# Patient Record
Sex: Male | Born: 1935 | Race: White | Hispanic: No | Marital: Married | Smoking: Never smoker
Health system: Southern US, Community
[De-identification: ages and names within clinical notes are randomized; demographics above are authoritative.]

## PROBLEM LIST (undated history)

## (undated) DIAGNOSIS — I10 Essential (primary) hypertension: Secondary | ICD-10-CM

## (undated) DIAGNOSIS — E7800 Pure hypercholesterolemia, unspecified: Secondary | ICD-10-CM

## (undated) DIAGNOSIS — M1990 Unspecified osteoarthritis, unspecified site: Secondary | ICD-10-CM

## (undated) HISTORY — DX: Unspecified osteoarthritis, unspecified site: M19.90

## (undated) HISTORY — PX: KNEE SURGERY: SHX244

## (undated) HISTORY — DX: Pure hypercholesterolemia, unspecified: E78.00

## (undated) HISTORY — DX: Essential (primary) hypertension: I10

---

## 2001-11-06 HISTORY — PX: CORONARY STENT PLACEMENT: SHX1402

## 2009-03-23 ENCOUNTER — Inpatient Hospital Stay (HOSPITAL_COMMUNITY): Admission: RE | Admit: 2009-03-23 | Discharge: 2009-03-26 | Payer: Self-pay | Admitting: Specialist

## 2009-05-21 ENCOUNTER — Ambulatory Visit (HOSPITAL_COMMUNITY): Admission: RE | Admit: 2009-05-21 | Discharge: 2009-05-21 | Payer: Self-pay | Admitting: Orthopedic Surgery

## 2009-11-14 IMAGING — CR DG ORBITS FOR FOREIGN BODY
2 series · 2 of 2 positions shown · non-contrast
Comparison: None

CLINICAL DATA: Pre MRI screening.  History of occasional metal
work.

ORBITS FOR FOREIGN BODY - 2 VIEW

[view not recorded (1 of 2)]
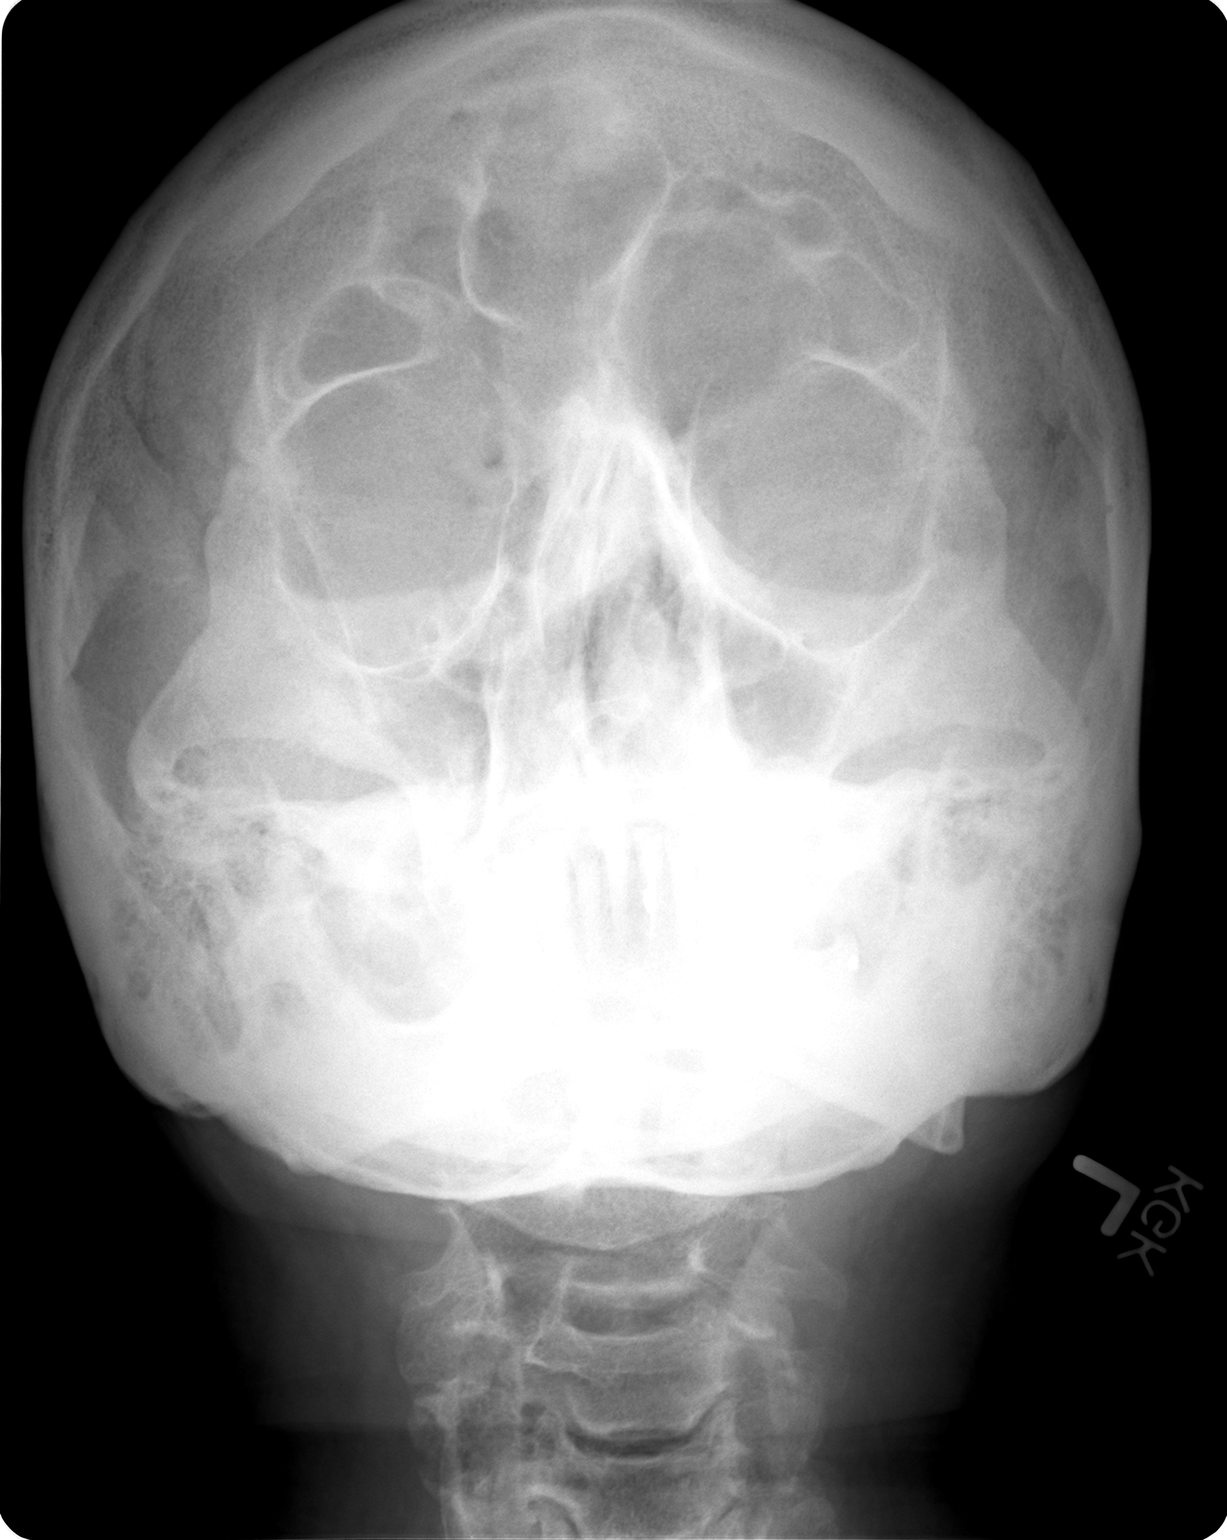

[view not recorded (2 of 2)]
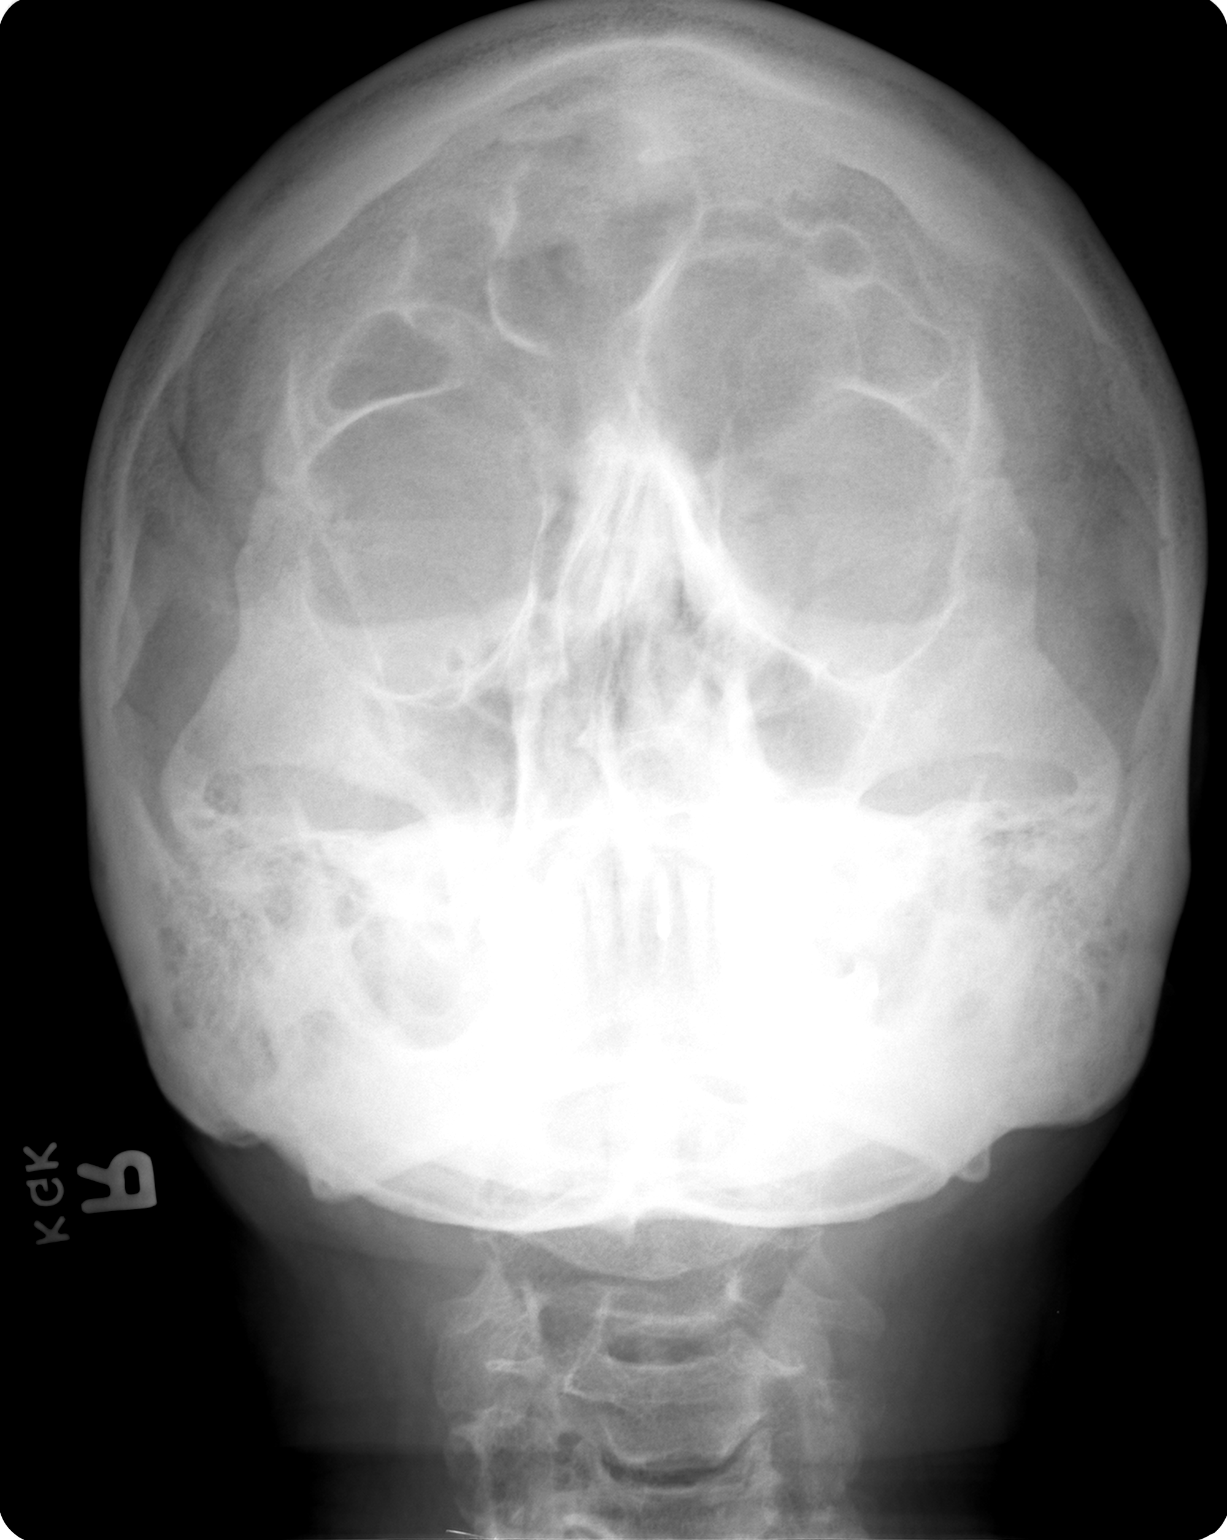

[2 of 2 positions shown; findings below may reference images not displayed]

FINDINGS: No metallic orbital foreign material is appreciated.
IMPRESSION: Negative for metallic orbital foreign body.

## 2011-02-14 LAB — CROSSMATCH

## 2011-02-14 LAB — DIFFERENTIAL
Basophils Absolute: 0 10*3/uL (ref 0.0–0.1)
Basophils Relative: 1 % (ref 0–1)
Eosinophils Absolute: 0.4 10*3/uL (ref 0.0–0.7)
Eosinophils Relative: 9 % — ABNORMAL HIGH (ref 0–5)
Monocytes Absolute: 0.2 10*3/uL (ref 0.1–1.0)
Monocytes Relative: 6 % (ref 3–12)
Neutro Abs: 2.6 10*3/uL (ref 1.7–7.7)

## 2011-02-14 LAB — CBC
HCT: 30.3 % — ABNORMAL LOW (ref 39.0–52.0)
HCT: 33.5 % — ABNORMAL LOW (ref 39.0–52.0)
HCT: 40 % (ref 39.0–52.0)
Hemoglobin: 11.3 g/dL — ABNORMAL LOW (ref 13.0–17.0)
MCHC: 34.2 g/dL (ref 30.0–36.0)
MCV: 97.4 fL (ref 78.0–100.0)
MCV: 98.3 fL (ref 78.0–100.0)
MCV: 98.8 fL (ref 78.0–100.0)
Platelets: 102 10*3/uL — ABNORMAL LOW (ref 150–400)
Platelets: 99 10*3/uL — ABNORMAL LOW (ref 150–400)
Platelets: 146 10*3/uL — ABNORMAL LOW (ref 150–400)
RBC: 3.11 MIL/uL — ABNORMAL LOW (ref 4.22–5.81)
RBC: 3.41 MIL/uL — ABNORMAL LOW (ref 4.22–5.81)
RDW: 12.4 % (ref 11.5–15.5)
WBC: 7.6 10*3/uL (ref 4.0–10.5)
WBC: 8.7 10*3/uL (ref 4.0–10.5)
WBC: 4.2 10*3/uL (ref 4.0–10.5)

## 2011-02-14 LAB — BASIC METABOLIC PANEL
BUN: 18 mg/dL (ref 6–23)
CO2: 27 mEq/L (ref 19–32)
Chloride: 106 mEq/L (ref 96–112)
Chloride: 107 mEq/L (ref 96–112)
Creatinine, Ser: 0.96 mg/dL (ref 0.4–1.5)
GFR calc Af Amer: >60 mL/min (ref >60)
Potassium: 4.1 mEq/L (ref 3.5–5.1)
Sodium: 139 mEq/L (ref 135–145)

## 2011-02-14 LAB — COMPREHENSIVE METABOLIC PANEL
ALT: 21 U/L (ref 0–53)
AST: 30 U/L (ref 0–37)
Albumin: 3.5 g/dL (ref 3.5–5.2)
Alkaline Phosphatase: 66 U/L (ref 39–117)
BUN: 21 mg/dL (ref 6–23)
Chloride: 110 mEq/L (ref 96–112)
Potassium: 3.8 mEq/L (ref 3.5–5.1)
Sodium: 142 mEq/L (ref 135–145)
Total Bilirubin: 0.8 mg/dL (ref 0.3–1.2)

## 2011-02-14 LAB — URINALYSIS, ROUTINE W REFLEX MICROSCOPIC
Bilirubin Urine: NEGATIVE
Glucose, UA: NEGATIVE mg/dL
Ketones, ur: NEGATIVE mg/dL
Specific Gravity, Urine: 1.022 (ref 1.005–1.030)
pH: 5.5 (ref 5.0–8.0)

## 2011-02-14 LAB — URINE MICROSCOPIC-ADD ON

## 2011-02-14 LAB — ABO/RH: ABO/RH(D): A NEG

## 2011-03-21 NOTE — Op Note (Signed)
[name], [name] NO.:  [id number]   MEDICAL RECORD NO.:  [id number]          PATIENT TYPE:  INP   LOCATION:  0011                         FACILITY:  [facility]   PHYSICIAN:  Erasmo Leventhal, M.D.DATE OF BIRTH:  02-12-1936   DATE OF PROCEDURE:  03/23/2009  DATE OF DISCHARGE:                               OPERATIVE REPORT   PREOPERATIVE DIAGNOSIS:  Left knee end-stage osteoarthritis.   POSTOPERATIVE DIAGNOSIS:  Left knee end-stage osteoarthritis.   PROCEDURE:  Left total knee arthroplasty.   SURGEON:  Erasmo Leventhal, M.D.   ASSISTANT:  Oneida Alar, PA-C.   ANESTHESIA:  Spinal with monitored anesthesia care.   ESTIMATED BLOOD LOSS:  Less than 100 mL.   DRAINS:  One Hemovac.   COMPLICATIONS:  None.   TOURNIQUET TIME:  One hour 20 minutes at 300 mmHg.   OPERATIVE DETAILS:  The patient was counseled in the holding area.  The  correct side was identified.  Taken to the operating room where spinal  anesthetic was administered.  IV antibiotics were given.  A Foley  catheter was placed utilizing sterile technique by the OR circulating  nurse.  All extremities well padded and bumped.  The left knee was  examined.  He lacked 5 degrees of full extension.  He could flex to 110  degrees.  Elevated, prepped with DuraPrep and draped in a sterile  fashion.  Exsanguinated with an Esmarch and tourniquet was inflated to  300 mmHg.  A straight midline incision was made through the skin  subcutaneous tissue.  A thickened prepatellar bursa was encountered.  It  was excised.  A medial parapatellar arthrotomy was performed.  A  proximal medial soft tissue release was done due to his varus  malalignment.  The cruciate ligaments were resected.  A starter hole  made in the distal femur.  The canal was irrigated, the effluent was  clear.  Intramedullary rod was gently placed.  I chose a 5 degree valgus  cut with a 10 mm cut off the distal femur.  The distal femur was  found  to be a size #5.  Rotation marks were set and cut to fit a size #5.  Medial and lateral menisci removed under direct visualization.  Geniculate vessels were coagulated.Marland Kitchen  Extramedullary alignment guide was  aligned from the tibia, set for correct rotation and slope.  I took a 2  mm cut off the most deficient side which was the medial side.  This gave  a nice resection of the proximal tibia.  Posteromedial and  posterolateral osteophytes were removed under direct visualization.  With flexion and extension, blocks for a 10 insert were well-balanced.   The tibia was exposed and found be a size #5.  Rotation coverage were  set, reamer and punch was performed.  __________ was performed a femoral  trial was placed.  With a size 5 femur, size 5 tibia and 10 insert were  well-balanced in flexion and extension.  Trials were balanced, gaps were  balanced, EPS were balanced.  The patella was found to be a size 41.  Appropriate amount of bone was resected and locking holes were made for  a size 41 patella.  The patella tracked anatomically.  All trials were  removed.  The knee was irrigated with pulsatile lavage.  Utilizing  modern cement technique, all components were cemented into place.  A  size 5 tibia, a size 5 femur with a 41 patella were cemented and held in  place with a 10 mm trial insert.  After the cement was cured, excess  cement was removed.  With a 10 insert, well-balanced in flexion and  extension, had excellent gaps, balance and stable to varus-valgus  stress.  The patellar track was anatomic.  The trial was removed and the  knee was again irrigated with pulsatile lavage and the final 10 mm  posterior stabilized rotating platform tibial insert was implanted.  The  knee was well-balanced with flexion and extension gaps and the patella  tracked anatomically.   A medium Hemovac drain was placed.  A sequential closure in layers were  done.  The arthrotomy was closed at 90 degrees  of flexion, subcu Vicryl,  skin with a subcuticular Monocryl suture.  Steri-Strips were applied.  The drain was hooked to suction.  Sterile dressing applied to the knee.  The tourniquet was deflated with normal circulation in the foot and  ankle at the end of the case.  Sponge and needle count were correct.  He  was taken from the operating room to the PACU in stable condition.   To help with surgical technique and decision making, Mr. Oneida Alar, PA-  C's assistance was needed throughout the entire case.           ______________________________  Erasmo Leventhal, M.D.     RAC/MEDQ  D:  03/23/2009  T:  03/23/2009  Job:  [id number]

## 2011-03-21 NOTE — Discharge Summary (Signed)
NAMETEAL, BONTRAGER NO.:  [id number]   MEDICAL RECORD NO.:  [id number]          PATIENT TYPE:  INP   LOCATION:  1612                         FACILITY:  [facility]   PHYSICIAN:  Erasmo Leventhal, M.D.DATE OF BIRTH:  Apr 02, 1936   DATE OF ADMISSION:  03/23/2009  DATE OF DISCHARGE:                               DISCHARGE SUMMARY   ADMISSION DIAGNOSES:  1. End-stage osteoarthritis bilateral knees, left greater than right.  2. Hypertension.  3. History of myocardial infarction with stenting in 2003 with stress      test 3 months previous reportedly within normal limits.  4. Hypercholesterolemia.  5. Benign prostatic hypertrophy.   DISCHARGE DIAGNOSIS:  1. Left total knee arthroplasty stable, doing well.  2. End-stage osteoarthritis, right knee.  3. Postoperative blood loss anemia, asymptomatic, allowed to self      correct with p.o. supplements.  4. Hypertension.  5. History of myocardial infarction with cardiac stenting.  6. History of hypercholesterolemia.  7. History of benign prostatic hypertrophy.   HISTORY OF PRESENT ILLNESS:  Patient is a 75 year old gentleman with  progressively worsening bilateral knee pain.  Left side is worse than  right.  The patient has elected to proceed with a total knee  arthroplasty.  X-rays show end-stage osteoarthritis.   ALLERGIES:  No known drug allergies.   MEDICATIONS ON ADMISSION:  1. Atenolol 50 mg a day.  2. Doxazosin 4 mg a day.  3. Diclofenac 75 mg 1 to 2 tablets daily, stop 10 days prior to      surgery.  4. Lovastatin 20 mg 2 tablets a day.  5. Baby aspirin 81 mg a day.   SURGICAL PROCEDURES:  On Mar 23, 2009, the patient was taken to the OR  by Dr. Valma Cava assisted by Oneida Alar, P.A.-C., under spinal  anesthesia with MAC supplement.  The patient underwent a left total knee  arthroplasty without any complications.  The patient tolerated the  procedure well and was transferred to the recovery room and  then to  orthopedic floor in good condition.  The patient had the following  components implanted:  A size 5 femoral component, a size 5 keel tibial  tray, a size five 10-mm thickness polyethylene bearing, size 41 three-  peg patella.  All components were implanted with polymethylmethacrylate.   CONSULTS:  The following routine consults were requested:  Physical  therapy, case management, pharmacy.   HOSPITAL COURSE:  On Mar 23, 2009, the patient was admitted to [facility] under the care of Dr. Valma Cava.  The patient was  taken to the OR where a left total knee arthroplasty was performed  without any complications.  The patient was transferred to the recovery  room and then the orthopedic floor with IV antibiotics, pain medicines  and Lovenox for DVT prophylaxis to follow total knee protocol.  The  patient then incurred a 3-day postoperative course in which the patient  did very well.  The first postoperative day, the patient was awake,  alert and he was without any complaints.  His pain was well controlled.  His vital signs were within normal limits.  His hemoglobin was  11.3/33.5.  Potassium was 4.1.  The patient's lower extremity had a soft  and nontender calf.  His Hemovac was discontinued intact without any  problems.  The patient did have a small area on his right upper  extremity where he had rubbed raw and broke through the skin, so it was  cleaned with some Betadine and was placed on some doxazosin which he  will continue for 10 days post hospital stay.  The patient then  continued to do very well throughout his hospitalization without any  issues.  Vital signs remained stable.  The patient progressed nicely  with physical therapy.  On postoperative day #2 in the a.m., he was  ambulating 80 feet and in the afternoon another 60 feet.  The patient  was following physical therapy instructions well and was considered to  be independent.  The patient's hemoglobin on  day 3 dropped to 9.9, but  his vital signs were stable.  His wound was benign for any signs of  infection.  His leg was neuromotor vascularly intact, and the patient  had no complaints, so arrangements were made for him to be discharged to  home for home health physical therapy to follow protocol and follow up  in 2 weeks with Dr. Thomasena Edis.   LABORATORY DATA:  CBC on admission found WBC 7.6, hemoglobin 11.3,  hematocrit 33.5, platelets 120.  On date of discharge, his white count  was 8.7, hemoglobin 9.9, hematocrit 29.0, platelets 99.  The patient's  vital signs were stable.  He was asymptomatic.  We would allow him to  self correct on p.o. supplements.  Routine chemistries on admission  found sodium 139, potassium 4.1, BUN 15, creatinine 0.95 with a glucose  of 119.  On postoperative check, his potassium had dropped down to 3.9,  BUN was 18, creatinine 0.96 with a glucose of 110.  EKG on admission:  Physician's interpretation indicated a sinus bradycardia at 46, inferior  infarct on July 2003 abnormal EKG.   DISCHARGE INSTRUCTIONS:   DIET:  No restrictions.   ACTIVITY:  The patient is slowly increase his activity as instructed  with physical therapy.  He is to currently use crutches or walker to  assist with ambulation.   WOUND CARE:  The patient is to keep his wound clean and dry.  Keep it  covered on a daily basis with bandages.  He is to shower with the old  bandage on and change it after it.   FOLLOW UP:  He needs a follow-up appointment with Dr. Thomasena Edis 2 weeks  from date of surgery.  The patient is to call [phone] for that follow-  up appointment.   MEDICATIONS:  1. Doxycycline 100 mg 1 tablet twice a day for a total of 10 days.  2. Robaxin 500 mg 1 tablet every 6 hours for muscle spasms if needed.  3. Lovenox injections 30 mg every 12 hours until gone up for a total      of 14 days.  4. Oxycodone 5 mg tablet 1 or 2 tablets every 3 to 4 hours for pain if      needed.   5. Iron 1 tablet twice a day.  He may pick it up over-the-counter at      pharmacy.  6. If any problems with constipation, the patient is to use Colace or      MiraLax.  He can pick that up at  the pharmacy.  7. Atenolol 50 mg once a day.  8. Doxazosin 4 mg once a day.  9. Diclofenac; he is not to take it until done with his Lovenox.  10.Lovastatin 20 mg 2 tablets a day.  11.Aspirin 81 mg a day.  He may continue that.  12.Vicodin.  He is to stop that until completed his oxycodone or he      does not need his oxycodone.   The patient's condition upon discharge to home is listed as improved and  good.      Jamelle Rushing, P.A.    ______________________________  Erasmo Leventhal, M.D.    RWK/MEDQ  D:  03/26/2009  T:  03/26/2009  Job:  [id number]   cc:   Erasmo Leventhal, M.D.  Fax: [phone]

## 2011-03-21 NOTE — H&P (Signed)
[name], [name] NO.:  [id number]   MEDICAL RECORD NO.:  [id number]          PATIENT TYPE:  INP   LOCATION:                               FACILITY:  [facility]   PHYSICIAN:  Erasmo Leventhal, M.D.DATE OF BIRTH:  03/15/1936   DATE OF ADMISSION:  03/23/2009  DATE OF DISCHARGE:                              HISTORY & PHYSICAL   CHIEF COMPLAINT:  Painful bilateral knees.   HISTORY OF PRESENT ILLNESS:  The patient is a 75 year old gentleman who  has had progressively worsening bilateral knee pain.  The left side is  worse than the right.  He has been having pain over the last 15-20  years.  He has had multiple cortisone injections over the last 6 months  without any improvement.  The patient feels that is significantly  affecting his activities of daily living.  It bothers him on a daily  basis.  X-rays reveal he has no varus deformities, end-stage  osteoarthritis.   ALLERGIES:  NO KNOWN DRUG ALLERGIES.   CURRENT MEDICATIONS:  1. Atenolol 50 mg a day.  2. Doxazosin 4 mg a day.  3. Diclofenac 75 mg 1-2 tablets daily; will stop 10 days prior.  4. Lovastatin 20 mg 2 tablets daily.  5. Baby aspirin 81 mg day; will continue up until the day before      surgery.   CURRENT MEDICAL HISTORY:  1. Hypertension.  2. Urinary retention, BPH.  3. Hypercholesterolemia.  4. History of an MI with cardiac stent in 2003.  Last cardiac stress      test 3 months previous with normal-appearing studies.  5. History of kidney stones.  6. History of rheumatoid arthritis.   REVIEW OF SYSTEMS:  He does have upper dentures.  He denies any  neurologic issues.  He denies any pulmonary issues.  He denies any  recent chest pains.  Last stress test was 3 months previous with  reported good results.  Denies any recent shortness of breath or chest  discomfort.  GI and GU is unremarkable.  Endocrine is unremarkable.   PAST SURGICAL HISTORY:  1. Appendectomy in 1957.  2. Ruptured disk  in the early 80s.   FAMILY MEDICAL HISTORY:  Father is deceased from an unknown cancer at  the age of 27.  Mother had significant lung disease.  Two brothers are  deceased, one at 6 months and 1 year.   SOCIAL HISTORY:  The patient is married.  He is currently retired over  the last 12 years.  He has never smoked.  No alcohol or drugs.  He has  one grown child.  He lives with his spouse.   PHYSICAL EXAMINATION:  GENERAL:  The patient is a healthy-appearing,  well-developed gentleman, conscious, alert and appropriate.  Does walk  with very obvious varus deformities of both lower extremities.  VITAL SIGNS:  He is 5 feet, 9 inches, 200 pounds, blood pressure is  128/78, pulse of 74 and regular, respirations 12, the patient is  afebrile.  GENERAL:  A well-developed gentleman, physically fit appearing.  HEENT:  Head was normocephalic.  Pupils equal, round and reactive.  Gross hearing is intact.  NECK:  Supple.  No palpable lymphadenopathy.  Good range of motion.  CHEST:  Lung sounds were clear and equal bilaterally.  No wheezes, rales  or rhonchi.  HEART:  Regular rate and rhythm.  No murmurs, rubs or gallops.  ABDOMEN:  Soft, nontender.  Bowel sounds present.  EXTREMITIES:  Upper extremities had symmetrical range of motion.  He has  significant full swollen knuckles at the phalangeal metacarpal joints.  Lower extremities - he had good range of motion of both hips today  without any discomfort.  His left knee - he lacked about 3 degrees of  extension.  He can flex it back to 120 degrees.  He did have a varus  deformity.  He was tender along the medial joint.  No effusion.  He did  have crepitus in the knee.  His right knee - he was able to fully extend  and flex it back to 125 degrees, varus deformity.  Calves were soft and  nontender.  No signs of phlebitis.  PERIPHERAL VASCULAR:  Carotid pulses were 2+, no bruits, radial pulses  2+, posterior tibial pulses were 2+.  He had no lower  extremity  pigmentation changes or edema.  NEUROLOGIC:  The patient was conscious, alert and appropriate.  Good  historian.  BREAST/RECTAL/GU:  Deferred at this time.   IMPRESSION:  1. End-stage osteoarthritis, bilateral knees, left greater than right      with varus deformity.  2. Hypertension.  3. History of myocardial infarction with cardiac stent in 2003 with      stress test 3 months previous reported within normal limits.  4. Hypercholesterolemia.  5. Benign prostatic hypertrophy with urinary retention.   PRIMARY CARE PHYSICIAN:  Dr. Tempie Donning in [place].   CARDIOLOGIST:  Dr. __________ at [facility].   PLAN:  The patient will undergo all routine labs and tests prior to  having a left total knee arthroplasty by Dr. Thomasena Edis at [facility] on Mar 23, 2009.  The patient's medical physician has cleared  him for this upcoming surgical procedure, and those notes will be  forwarded to the hospital.      Jamelle Rushing, P.A.    ______________________________  Erasmo Leventhal, M.D.    RWK/MEDQ  D:  03/10/2009  T:  03/10/2009  Job:  [id number]

## 2016-12-26 ENCOUNTER — Ambulatory Visit (INDEPENDENT_AMBULATORY_CARE_PROVIDER_SITE_OTHER): Payer: Medicare HMO | Admitting: Pediatrics

## 2016-12-26 ENCOUNTER — Encounter: Payer: Self-pay | Admitting: Pediatrics

## 2016-12-26 VITALS — BP 126/72 | HR 67 | Temp 97.8°F | Resp 20 | Ht 69.0 in | Wt 207.0 lb

## 2016-12-26 DIAGNOSIS — I10 Essential (primary) hypertension: Secondary | ICD-10-CM

## 2016-12-26 DIAGNOSIS — Z96653 Presence of artificial knee joint, bilateral: Secondary | ICD-10-CM | POA: Diagnosis not present

## 2016-12-26 DIAGNOSIS — L253 Unspecified contact dermatitis due to other chemical products: Secondary | ICD-10-CM | POA: Diagnosis not present

## 2016-12-26 NOTE — Progress Notes (Signed)
[address] [place] [address] Dept: [phone]  New Patient Note  Patient ID: Patrick BaneDonald R Harvey, male    DOB: [birth year]  Age: 81 y.o. MRN: [id number] Date of Office Visit: 12/26/2016 Referring provider: Nolon BussingMichael J. Samuel BoucheLucas, MD [address] [place], [place] [address]    Chief Complaint: Allergy Testing  HPI [name] presents for Patch testing to metals. He had a knee replacement in 2013. This was the right knee and he has not been able to bend the knee well since then. There has not been a rash noted around the knee. Dr. Samuel BoucheLucas requested patch testing to metals in the event that the knee has to be replaced again. He does not give me a history suggestive of metal allergy to jewelry. Dr. Samuel BoucheLucas aspirated the right knee for evaluation of a mild effusion  Review of Systems  Constitutional: Negative.   HENT: Negative.   Eyes: Negative.   Respiratory: Negative.   Cardiovascular:       Hypertension. Two coronary artery stents  Gastrointestinal: Negative.   Genitourinary:       Urinary frequency. Enlarged prostate  Musculoskeletal:       Osteoarthritis. 2 knee replacements  Skin: Negative.   Neurological: Negative.   Endo/Heme/Allergies:       No diabetes or thyroid disease  Psychiatric/Behavioral: Negative.     Outpatient Encounter Prescriptions as of 12/26/2016  Medication Sig  . amLODipine (NORVASC) 5 MG tablet TAKE 1 TABLET EVERY DAY  . aspirin 81 MG tablet Take by mouth.  Marland Kitchen. atenolol (TENORMIN) 50 MG tablet Take 50 mg by mouth daily.  . diclofenac (VOLTAREN) 75 MG EC tablet TAKE 1 TABLET TWICE DAILY  . lovastatin (MEVACOR) 40 MG tablet TAKE 1 TABLET EVERY DAY FOR CHOLESTEROL  . tamsulosin (FLOMAX) 0.4 MG CAPS capsule TAKE 2 CAPSULES EVERY NIGHT  FOR  PROSTATE  SYMPTOMS   No facility-administered encounter medications on file as of 12/26/2016.      Drug Allergies:  Allergies  Allergen Reactions  . Clopidogrel Rash    Family  History: [name]'s family history is not on file..There is no family history of asthma, hayfever, sinus problems, eczema, hives or food allergies.  Social and environmental. There are no pets in the home. He has never smoked cigarettes. 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 He appears well-developed and well-nourished.  HENT:  Eyes normal. Ears normal. Nose normal. Pharynx normal.  Neck: Neck supple. No thyromegaly present.  Cardiovascular:  S1 and S2 normal no murmurs  Pulmonary/Chest:  Clear to percussion and auscultation  Abdominal: Soft. There is no tenderness (no hepatosplenomegaly).  Musculoskeletal:  He could not bend the right knee well. There was no erythema around the knee. There was no temperature change compared to the other knee. Mild pain on flexion  Lymphadenopathy:    He has no cervical adenopathy.  Neurological: He is alert and oriented to person, place, and time.  Skin:  Clear  Psychiatric: He has a normal mood and affect. His behavior is normal. Judgment and thought content normal.  Vitals reviewed.   Diagnostics: Patch tests to metals  were placed   Assessment  Assessment and Plan: 1. Contact dermatitis due to chemicals   2. Essential hypertension   3. Status post bilateral knee replacements        Patient Instructions  Patch  tests  to metals were applied Follow-up in 2 days for the initial reading   Return in about 2 days (around 12/28/2016).   Thank you for the opportunity to care for this patient.  Please do not hesitate to contact me with questions.  Tonette Bihari, M.D.  [facility] [address] [place], [place] [address] [phone]

## 2016-12-27 DIAGNOSIS — L253 Unspecified contact dermatitis due to other chemical products: Secondary | ICD-10-CM | POA: Insufficient documentation

## 2016-12-27 DIAGNOSIS — Z96653 Presence of artificial knee joint, bilateral: Secondary | ICD-10-CM | POA: Insufficient documentation

## 2016-12-27 DIAGNOSIS — I10 Essential (primary) hypertension: Secondary | ICD-10-CM | POA: Insufficient documentation

## 2016-12-27 NOTE — Patient Instructions (Addendum)
Patch  tests  to metals were applied Follow-up in 2 days for the initial reading

## 2016-12-28 ENCOUNTER — Encounter: Payer: Medicare HMO | Admitting: Pediatrics

## 2016-12-29 NOTE — Progress Notes (Signed)
At 48 hours, and the patch testing to metals was completely negative

## 2017-01-01 ENCOUNTER — Encounter: Payer: Self-pay | Admitting: *Deleted

## 2017-01-01 ENCOUNTER — Encounter: Payer: Medicare HMO | Admitting: Pediatrics

## 2017-01-01 NOTE — Progress Notes (Signed)
Mr. [name] returns 6 days after patch testing to metals was applied. Today all the patch testing to metals was negative. Results were sent to Dr. Samuel BoucheLucas who is his orthopedist. Dr. Samuel BoucheLucas plans to clean out the right knee replacements

## 2017-04-04 ENCOUNTER — Telehealth: Payer: Self-pay | Admitting: Pediatrics

## 2017-04-04 NOTE — Telephone Encounter (Signed)
Patient asking for a detailed receipt for all payyments made for reimbursement. Please call for clarification. Thanks

## 2017-04-04 NOTE — Telephone Encounter (Signed)
Mailed printout °
